# Patient Record
Sex: Male | Born: 1935 | Marital: Married
Health system: Southern US, Community
[De-identification: ages and names within clinical notes are randomized; demographics above are authoritative.]

---

## 2004-06-25 ENCOUNTER — Emergency Department: Payer: Self-pay | Admitting: Emergency Medicine

## 2006-10-14 ENCOUNTER — Other Ambulatory Visit: Payer: Self-pay

## 2006-10-14 ENCOUNTER — Emergency Department: Payer: Self-pay | Admitting: Unknown Physician Specialty

## 2009-05-06 ENCOUNTER — Ambulatory Visit: Payer: Self-pay | Admitting: Family Medicine

## 2009-05-21 ENCOUNTER — Ambulatory Visit: Payer: Self-pay | Admitting: Urology

## 2009-07-24 ENCOUNTER — Emergency Department: Payer: Self-pay | Admitting: Emergency Medicine

## 2010-04-20 ENCOUNTER — Encounter: Payer: Self-pay | Admitting: Cardiology

## 2010-04-30 ENCOUNTER — Encounter: Payer: Self-pay | Admitting: Cardiology

## 2010-05-26 ENCOUNTER — Emergency Department: Payer: Self-pay | Admitting: Emergency Medicine

## 2010-06-01 ENCOUNTER — Encounter: Payer: Self-pay | Admitting: Cardiology

## 2010-06-04 ENCOUNTER — Emergency Department: Payer: Self-pay | Admitting: Unknown Physician Specialty

## 2010-06-30 ENCOUNTER — Encounter: Payer: Self-pay | Admitting: Cardiology

## 2010-07-30 ENCOUNTER — Encounter: Payer: Self-pay | Admitting: Cardiology

## 2010-08-30 ENCOUNTER — Encounter: Payer: Self-pay | Admitting: Cardiology

## 2011-01-20 ENCOUNTER — Encounter: Payer: Self-pay | Admitting: Internal Medicine

## 2011-01-29 ENCOUNTER — Encounter: Payer: Self-pay | Admitting: Internal Medicine

## 2011-02-28 ENCOUNTER — Encounter: Payer: Self-pay | Admitting: Internal Medicine

## 2011-03-31 ENCOUNTER — Encounter: Payer: Self-pay | Admitting: Internal Medicine

## 2012-06-01 ENCOUNTER — Encounter: Payer: Self-pay | Admitting: Internal Medicine

## 2012-06-09 ENCOUNTER — Ambulatory Visit: Payer: Self-pay | Admitting: Vascular Surgery

## 2012-06-13 LAB — CBC WITH DIFFERENTIAL/PLATELET
Eosinophil %: 0.9 %
HCT: 27.8 % — ABNORMAL LOW (ref 40.0–52.0)
Lymphocyte #: 0.9 10*3/uL — ABNORMAL LOW (ref 1.0–3.6)
MCH: 28.7 pg (ref 26.0–34.0)
MCV: 89 fL (ref 80–100)
Monocyte #: 1.5 x10 3/mm — ABNORMAL HIGH (ref 0.2–1.0)
Monocyte %: 16.6 %
Neutrophil #: 6.2 10*3/uL (ref 1.4–6.5)
Neutrophil %: 70.3 %
RBC: 3.11 10*6/uL — ABNORMAL LOW (ref 4.40–5.90)
RDW: 22.6 % — ABNORMAL HIGH (ref 11.5–14.5)
WBC: 8.8 10*3/uL (ref 3.8–10.6)

## 2012-06-14 LAB — CBC WITH DIFFERENTIAL/PLATELET
Basophil #: 0.1 10*3/uL (ref 0.0–0.1)
Eosinophil #: 0.1 10*3/uL (ref 0.0–0.7)
HCT: 26.6 % — ABNORMAL LOW (ref 40.0–52.0)
HGB: 8.6 g/dL — ABNORMAL LOW (ref 13.0–18.0)
Lymphocyte #: 0.6 10*3/uL — ABNORMAL LOW (ref 1.0–3.6)
MCH: 29 pg (ref 26.0–34.0)
MCHC: 32.3 g/dL (ref 32.0–36.0)
MCV: 90 fL (ref 80–100)
Monocyte #: 1.1 x10 3/mm — ABNORMAL HIGH (ref 0.2–1.0)
Neutrophil #: 5.2 10*3/uL (ref 1.4–6.5)
Platelet: 417 10*3/uL (ref 150–440)
RBC: 2.97 10*6/uL — ABNORMAL LOW (ref 4.40–5.90)
RDW: 22.5 % — ABNORMAL HIGH (ref 11.5–14.5)

## 2012-06-14 LAB — COMPREHENSIVE METABOLIC PANEL
Albumin: 2.6 g/dL — ABNORMAL LOW (ref 3.4–5.0)
Alkaline Phosphatase: 76 U/L (ref 50–136)
BUN: 34 mg/dL — ABNORMAL HIGH (ref 7–18)
Bilirubin,Total: 0.9 mg/dL (ref 0.2–1.0)
Calcium, Total: 7.9 mg/dL — ABNORMAL LOW (ref 8.5–10.1)
Creatinine: 3.26 mg/dL — ABNORMAL HIGH (ref 0.60–1.30)
Glucose: 196 mg/dL — ABNORMAL HIGH (ref 65–99)
SGOT(AST): 52 U/L — ABNORMAL HIGH (ref 15–37)
SGPT (ALT): 107 U/L — ABNORMAL HIGH (ref 12–78)
Total Protein: 4.7 g/dL — ABNORMAL LOW (ref 6.4–8.2)

## 2012-06-30 ENCOUNTER — Encounter: Payer: Self-pay | Admitting: Internal Medicine

## 2012-07-06 LAB — COMPREHENSIVE METABOLIC PANEL
Alkaline Phosphatase: 74 U/L (ref 50–136)
Anion Gap: 6 — ABNORMAL LOW (ref 7–16)
BUN: 40 mg/dL — ABNORMAL HIGH (ref 7–18)
Bilirubin,Total: 1.2 mg/dL — ABNORMAL HIGH (ref 0.2–1.0)
Co2: 32 mmol/L (ref 21–32)
Creatinine: 3.14 mg/dL — ABNORMAL HIGH (ref 0.60–1.30)
EGFR (Non-African Amer.): 18 — ABNORMAL LOW
Potassium: 3.9 mmol/L (ref 3.5–5.1)
SGOT(AST): 13 U/L — ABNORMAL LOW (ref 15–37)
SGPT (ALT): 12 U/L (ref 12–78)

## 2012-07-30 ENCOUNTER — Encounter: Payer: Self-pay | Admitting: Internal Medicine

## 2012-07-30 ENCOUNTER — Ambulatory Visit: Payer: Self-pay | Admitting: Internal Medicine

## 2012-08-08 ENCOUNTER — Inpatient Hospital Stay: Payer: Self-pay | Admitting: Family Medicine

## 2012-08-08 LAB — COMPREHENSIVE METABOLIC PANEL
Albumin: 2.7 g/dL — ABNORMAL LOW (ref 3.4–5.0)
Bilirubin,Total: 1.3 mg/dL — ABNORMAL HIGH (ref 0.2–1.0)
Chloride: 97 mmol/L — ABNORMAL LOW (ref 98–107)
Co2: 33 mmol/L — ABNORMAL HIGH (ref 21–32)
Glucose: 237 mg/dL — ABNORMAL HIGH (ref 65–99)
SGOT(AST): 20 U/L (ref 15–37)
SGPT (ALT): 16 U/L (ref 12–78)
Sodium: 136 mmol/L (ref 136–145)
Total Protein: 5.2 g/dL — ABNORMAL LOW (ref 6.4–8.2)

## 2012-08-08 LAB — CBC
HCT: 32.5 % — ABNORMAL LOW (ref 40.0–52.0)
MCH: 28.6 pg (ref 26.0–34.0)
MCHC: 31.5 g/dL — ABNORMAL LOW (ref 32.0–36.0)
MCV: 91 fL (ref 80–100)
RBC: 3.57 10*6/uL — ABNORMAL LOW (ref 4.40–5.90)
RDW: 21.8 % — ABNORMAL HIGH (ref 11.5–14.5)
WBC: 16.8 10*3/uL — ABNORMAL HIGH (ref 3.8–10.6)

## 2012-08-08 LAB — TROPONIN I: Troponin-I: 0.07 ng/mL — ABNORMAL HIGH

## 2012-08-08 LAB — CK TOTAL AND CKMB (NOT AT ARMC)
CK-MB: 1.7 ng/mL (ref 0.5–3.6)
CK-MB: 1.4 ng/mL (ref 0.5–3.6)

## 2012-08-09 LAB — BASIC METABOLIC PANEL
Anion Gap: 15 (ref 7–16)
Calcium, Total: 7.8 mg/dL — ABNORMAL LOW (ref 8.5–10.1)
Chloride: 97 mmol/L — ABNORMAL LOW (ref 98–107)
EGFR (Non-African Amer.): 23 — ABNORMAL LOW
Glucose: 210 mg/dL — ABNORMAL HIGH (ref 65–99)
Osmolality: 285 (ref 275–301)
Potassium: 4.1 mmol/L (ref 3.5–5.1)
Sodium: 137 mmol/L (ref 136–145)

## 2012-08-09 LAB — CBC WITH DIFFERENTIAL/PLATELET
Basophil #: 0.1 10*3/uL (ref 0.0–0.1)
Basophil %: 0.6 %
Eosinophil #: 0 10*3/uL (ref 0.0–0.7)
HCT: 30.6 % — ABNORMAL LOW (ref 40.0–52.0)
HGB: 9.6 g/dL — ABNORMAL LOW (ref 13.0–18.0)
MCH: 28.7 pg (ref 26.0–34.0)
MCHC: 31.4 g/dL — ABNORMAL LOW (ref 32.0–36.0)
Monocyte #: 1.3 x10 3/mm — ABNORMAL HIGH (ref 0.2–1.0)
Monocyte %: 8.5 %
Neutrophil #: 13.1 10*3/uL — ABNORMAL HIGH (ref 1.4–6.5)
Neutrophil %: 86.4 %
RDW: 21.2 % — ABNORMAL HIGH (ref 11.5–14.5)
WBC: 15.2 10*3/uL — ABNORMAL HIGH (ref 3.8–10.6)

## 2012-08-09 LAB — CK TOTAL AND CKMB (NOT AT ARMC)
CK, Total: 32 U/L — ABNORMAL LOW (ref 35–232)
CK-MB: 1.2 ng/mL (ref 0.5–3.6)

## 2012-08-09 LAB — HEMOGLOBIN: HGB: 9.7 g/dL — ABNORMAL LOW (ref 13.0–18.0)

## 2012-08-09 LAB — PROTIME-INR: Prothrombin Time: 19.2 secs — ABNORMAL HIGH (ref 11.5–14.7)

## 2012-08-10 LAB — BASIC METABOLIC PANEL
Anion Gap: 10 (ref 7–16)
BUN: 39 mg/dL — ABNORMAL HIGH (ref 7–18)
Chloride: 98 mmol/L (ref 98–107)
Co2: 29 mmol/L (ref 21–32)
Creatinine: 2.95 mg/dL — ABNORMAL HIGH (ref 0.60–1.30)
EGFR (African American): 22 — ABNORMAL LOW
EGFR (Non-African Amer.): 19 — ABNORMAL LOW
Osmolality: 284 (ref 275–301)
Sodium: 137 mmol/L (ref 136–145)

## 2012-08-10 LAB — CBC WITH DIFFERENTIAL/PLATELET
HGB: 9.2 g/dL — ABNORMAL LOW (ref 13.0–18.0)
Lymphocytes: 7 %
MCH: 29.8 pg (ref 26.0–34.0)
MCHC: 33.1 g/dL (ref 32.0–36.0)
Monocytes: 9 %
RBC: 3.07 10*6/uL — ABNORMAL LOW (ref 4.40–5.90)
Segmented Neutrophils: 80 %
WBC: 17.4 10*3/uL — ABNORMAL HIGH (ref 3.8–10.6)

## 2012-08-10 LAB — LIPID PANEL: Cholesterol: 60 mg/dL (ref 0–200)

## 2012-08-10 LAB — PHOSPHORUS: Phosphorus: 4.1 mg/dL (ref 2.5–4.9)

## 2012-08-11 LAB — HEMOGLOBIN: HGB: 7.7 g/dL — ABNORMAL LOW (ref 13.0–18.0)

## 2012-08-12 LAB — CBC WITH DIFFERENTIAL/PLATELET
Basophil #: 0 10*3/uL (ref 0.0–0.1)
Eosinophil #: 0 10*3/uL (ref 0.0–0.7)
HCT: 26 % — ABNORMAL LOW (ref 40.0–52.0)
HGB: 8.6 g/dL — ABNORMAL LOW (ref 13.0–18.0)
Lymphocyte #: 0.6 10*3/uL — ABNORMAL LOW (ref 1.0–3.6)
Lymphocyte %: 6.3 %
MCH: 29.8 pg (ref 26.0–34.0)
MCHC: 33.2 g/dL (ref 32.0–36.0)
MCV: 90 fL (ref 80–100)
Neutrophil #: 7.8 10*3/uL — ABNORMAL HIGH (ref 1.4–6.5)
Neutrophil %: 81.6 %
Platelet: 504 10*3/uL — ABNORMAL HIGH (ref 150–440)
RDW: 19.7 % — ABNORMAL HIGH (ref 11.5–14.5)
WBC: 9.5 10*3/uL (ref 3.8–10.6)

## 2012-08-12 LAB — PHOSPHORUS: Phosphorus: 3.7 mg/dL (ref 2.5–4.9)

## 2012-08-13 LAB — CBC WITH DIFFERENTIAL/PLATELET
Basophil #: 0.1 10*3/uL
Basophil %: 0.5 %
Eosinophil #: 0.1 10*3/uL
Eosinophil %: 0.6 %
HCT: 28.5 % — ABNORMAL LOW
HGB: 9.6 g/dL — ABNORMAL LOW
Lymphocyte %: 8.4 %
Lymphs Abs: 0.8 10*3/uL — ABNORMAL LOW
MCH: 30.3 pg
MCHC: 33.9 g/dL
MCV: 90 fL
Monocyte #: 1.4 10*3/uL — ABNORMAL HIGH
Monocyte %: 14.2 %
Neutrophil #: 7.5 10*3/uL — ABNORMAL HIGH
Neutrophil %: 76.3 %
Platelet: 493 10*3/uL — ABNORMAL HIGH
RBC: 3.18 x10 6/mm 3 — ABNORMAL LOW
RDW: 19.9 % — ABNORMAL HIGH
WBC: 9.9 10*3/uL

## 2012-08-13 LAB — BASIC METABOLIC PANEL
Anion Gap: 6 — ABNORMAL LOW (ref 7–16)
BUN: 25 mg/dL — ABNORMAL HIGH (ref 7–18)
Calcium, Total: 7.6 mg/dL — ABNORMAL LOW (ref 8.5–10.1)
Chloride: 101 mmol/L (ref 98–107)
EGFR (African American): 34 — ABNORMAL LOW
EGFR (Non-African Amer.): 29 — ABNORMAL LOW
Glucose: 125 mg/dL — ABNORMAL HIGH (ref 65–99)
Osmolality: 282 (ref 275–301)
Sodium: 138 mmol/L (ref 136–145)

## 2012-08-15 LAB — CBC WITH DIFFERENTIAL/PLATELET
Eosinophil: 3 %
MCH: 28.6 pg (ref 26.0–34.0)
MCHC: 31.5 g/dL — ABNORMAL LOW (ref 32.0–36.0)
MCV: 91 fL (ref 80–100)
Metamyelocyte: 1 %
Monocytes: 8 %
Myelocyte: 2 %
Platelet: 596 10*3/uL — ABNORMAL HIGH (ref 150–440)
RDW: 20.1 % — ABNORMAL HIGH (ref 11.5–14.5)

## 2012-08-15 LAB — BASIC METABOLIC PANEL
Anion Gap: 9 (ref 7–16)
BUN: 43 mg/dL — ABNORMAL HIGH (ref 7–18)
Calcium, Total: 7.6 mg/dL — ABNORMAL LOW (ref 8.5–10.1)
EGFR (African American): 21 — ABNORMAL LOW
EGFR (Non-African Amer.): 18 — ABNORMAL LOW
Osmolality: 282 (ref 275–301)
Potassium: 4 mmol/L (ref 3.5–5.1)

## 2012-08-17 LAB — CBC WITH DIFFERENTIAL/PLATELET
Bands: 2 %
HGB: 9.2 g/dL — ABNORMAL LOW (ref 13.0–18.0)
MCH: 28.5 pg (ref 26.0–34.0)
MCHC: 31.4 g/dL — ABNORMAL LOW (ref 32.0–36.0)
Metamyelocyte: 3 %
Myelocyte: 2 %
RBC: 3.24 10*6/uL — ABNORMAL LOW (ref 4.40–5.90)
RDW: 19.9 % — ABNORMAL HIGH (ref 11.5–14.5)
Segmented Neutrophils: 76 %

## 2012-08-17 LAB — BASIC METABOLIC PANEL
Calcium, Total: 7.5 mg/dL — ABNORMAL LOW (ref 8.5–10.1)
Co2: 25 mmol/L (ref 21–32)
Creatinine: 2.87 mg/dL — ABNORMAL HIGH (ref 0.60–1.30)
EGFR (Non-African Amer.): 20 — ABNORMAL LOW
Osmolality: 283 (ref 275–301)

## 2012-08-17 LAB — PHOSPHORUS: Phosphorus: 2.5 mg/dL (ref 2.5–4.9)

## 2012-08-18 LAB — CBC WITH DIFFERENTIAL/PLATELET
HCT: 30.9 % — ABNORMAL LOW (ref 40.0–52.0)
Lymphocytes: 19 %
MCH: 28.3 pg (ref 26.0–34.0)
MCHC: 31.1 g/dL — ABNORMAL LOW (ref 32.0–36.0)
MCV: 91 fL (ref 80–100)
Monocytes: 4 %
Platelet: 697 10*3/uL — ABNORMAL HIGH (ref 150–440)
RBC: 3.39 10*6/uL — ABNORMAL LOW (ref 4.40–5.90)
RDW: 19.3 % — ABNORMAL HIGH (ref 11.5–14.5)
Segmented Neutrophils: 72 %
WBC: 10.3 10*3/uL (ref 3.8–10.6)

## 2012-08-19 LAB — BASIC METABOLIC PANEL
Anion Gap: 6 — ABNORMAL LOW (ref 7–16)
BUN: 28 mg/dL — ABNORMAL HIGH (ref 7–18)
Calcium, Total: 7.6 mg/dL — ABNORMAL LOW (ref 8.5–10.1)
Chloride: 101 mmol/L (ref 98–107)
EGFR (African American): 26 — ABNORMAL LOW
EGFR (Non-African Amer.): 23 — ABNORMAL LOW
Glucose: 144 mg/dL — ABNORMAL HIGH (ref 65–99)
Osmolality: 286 (ref 275–301)
Potassium: 3.6 mmol/L (ref 3.5–5.1)

## 2012-08-19 LAB — CBC WITH DIFFERENTIAL/PLATELET
Basophil #: 0 10*3/uL (ref 0.0–0.1)
Basophil %: 0.4 %
Eosinophil #: 0.1 10*3/uL (ref 0.0–0.7)
HCT: 27.7 % — ABNORMAL LOW (ref 40.0–52.0)
Lymphocyte #: 0.9 10*3/uL — ABNORMAL LOW (ref 1.0–3.6)
Lymphocyte %: 9.7 %
MCH: 29.2 pg (ref 26.0–34.0)
MCHC: 32.4 g/dL (ref 32.0–36.0)
MCV: 90 fL (ref 80–100)
Monocyte #: 1.4 x10 3/mm — ABNORMAL HIGH (ref 0.2–1.0)
Neutrophil #: 7.1 10*3/uL — ABNORMAL HIGH (ref 1.4–6.5)
RDW: 19.7 % — ABNORMAL HIGH (ref 11.5–14.5)
WBC: 9.5 10*3/uL (ref 3.8–10.6)

## 2012-08-19 LAB — PHOSPHORUS: Phosphorus: 2.5 mg/dL (ref 2.5–4.9)

## 2012-08-21 LAB — URINALYSIS, COMPLETE
Bacteria: NONE SEEN
Blood: NEGATIVE
Glucose,UR: 50 mg/dL (ref 0–75)
Leukocyte Esterase: NEGATIVE
Nitrite: NEGATIVE
Ph: 5 (ref 4.5–8.0)
Protein: 100
RBC,UR: 1 /HPF (ref 0–5)
Specific Gravity: 1.02 (ref 1.003–1.030)

## 2012-08-21 LAB — RENAL FUNCTION PANEL
Albumin: 1.9 g/dL — ABNORMAL LOW (ref 3.4–5.0)
Anion Gap: 8 (ref 7–16)
BUN: 26 mg/dL — ABNORMAL HIGH (ref 7–18)
Calcium, Total: 7.8 mg/dL — ABNORMAL LOW (ref 8.5–10.1)
Co2: 30 mmol/L (ref 21–32)
Creatinine: 2.7 mg/dL — ABNORMAL HIGH (ref 0.60–1.30)
EGFR (African American): 25 — ABNORMAL LOW
Glucose: 163 mg/dL — ABNORMAL HIGH (ref 65–99)
Phosphorus: 2.4 mg/dL — ABNORMAL LOW (ref 2.5–4.9)
Sodium: 139 mmol/L (ref 136–145)

## 2012-08-21 LAB — CBC WITH DIFFERENTIAL/PLATELET
Basophil %: 0.7 %
HCT: 30.7 % — ABNORMAL LOW (ref 40.0–52.0)
HGB: 9.4 g/dL — ABNORMAL LOW (ref 13.0–18.0)
Lymphocyte #: 0.6 10*3/uL — ABNORMAL LOW (ref 1.0–3.6)
MCH: 28.2 pg (ref 26.0–34.0)
MCHC: 30.8 g/dL — ABNORMAL LOW (ref 32.0–36.0)
MCV: 92 fL (ref 80–100)
Monocyte #: 1.2 x10 3/mm — ABNORMAL HIGH (ref 0.2–1.0)
Monocyte %: 9.5 %
Neutrophil #: 10.8 10*3/uL — ABNORMAL HIGH (ref 1.4–6.5)
Platelet: 663 10*3/uL — ABNORMAL HIGH (ref 150–440)
RDW: 19.6 % — ABNORMAL HIGH (ref 11.5–14.5)

## 2012-08-22 DIAGNOSIS — I472 Ventricular tachycardia: Secondary | ICD-10-CM

## 2012-08-23 LAB — PHOSPHORUS: Phosphorus: 2.6 mg/dL (ref 2.5–4.9)

## 2012-08-30 ENCOUNTER — Ambulatory Visit: Payer: Self-pay | Admitting: Internal Medicine

## 2012-08-30 DEATH — deceased

## 2014-04-09 IMAGING — CT CT HEAD WITHOUT CONTRAST
2 series · 15 of 30 positions shown, 19 images · non-contrast
Comparison: none

REASON FOR EXAM: fall hit head
COMMENTS:   May transport without cardiac monitor

PROCEDURE:     CT  - CT HEAD WITHOUT CONTRAST  - August 08, 2012 [DATE]
RESULT:     Technique: Helical noncontrasted 5 mm sections were obtained
from the skull base through the vertex.

[Series 2: without · axial · non-contrast · 0.46mm/px · z∈[-175,-45]mm · 13 of 32 slices shown, 17 images]
[im 3/32  brain]
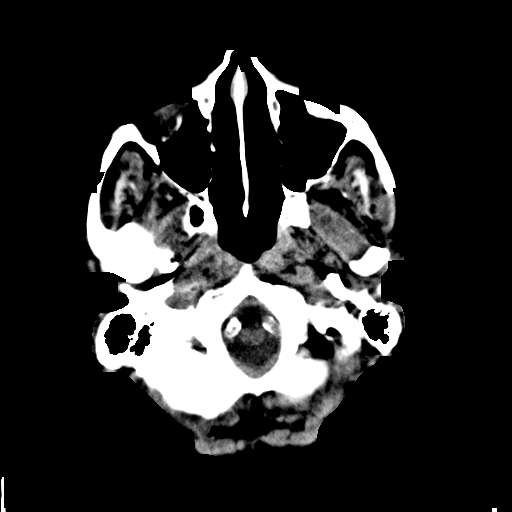
[im 3/32  bone]
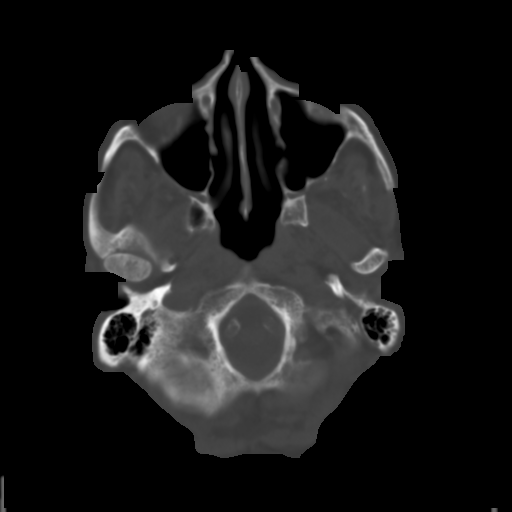
[im 5/32  brain]
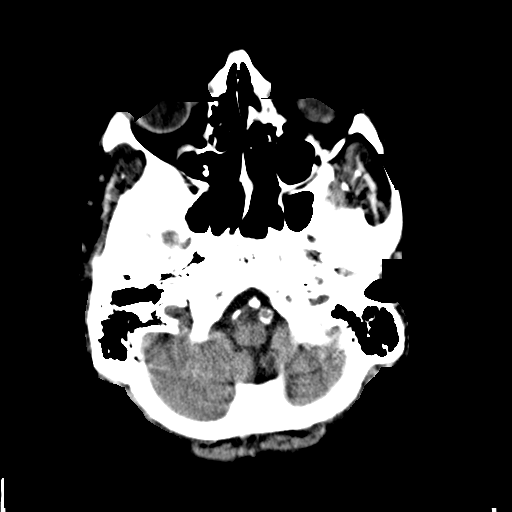
[im 7/32  brain]
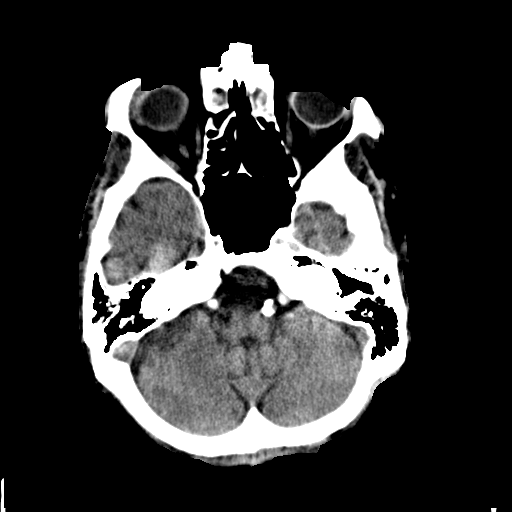
[im 9/32  brain]
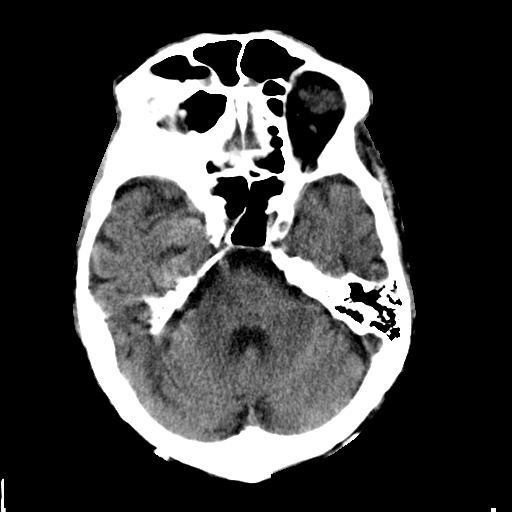
[im 12/32  brain]
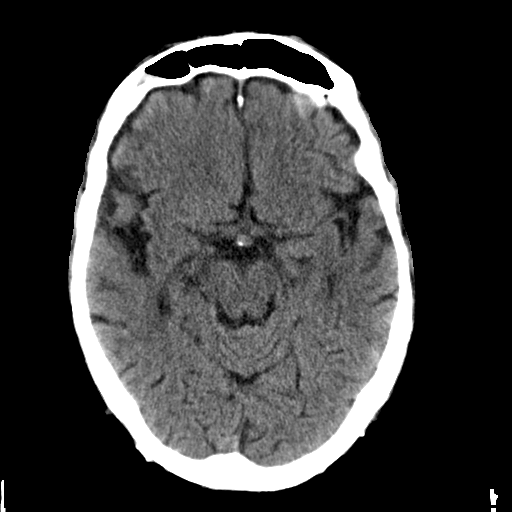
[im 12/32  bone]
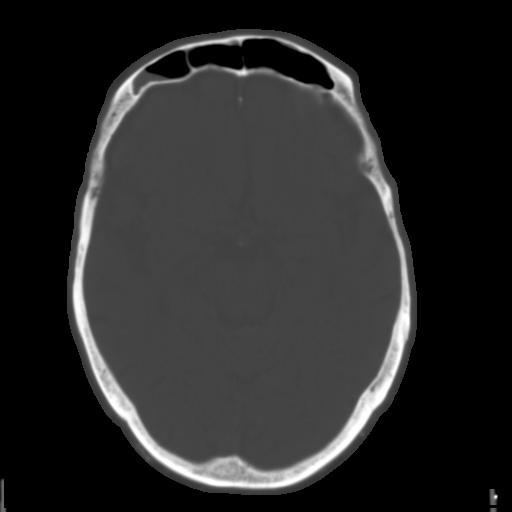
[im 14/32  brain]
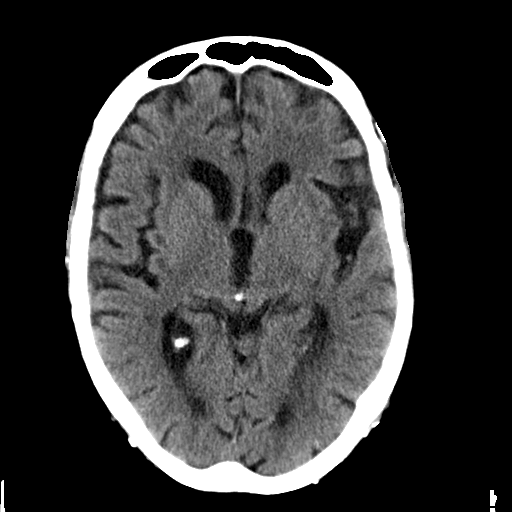
[im 16/32  brain]
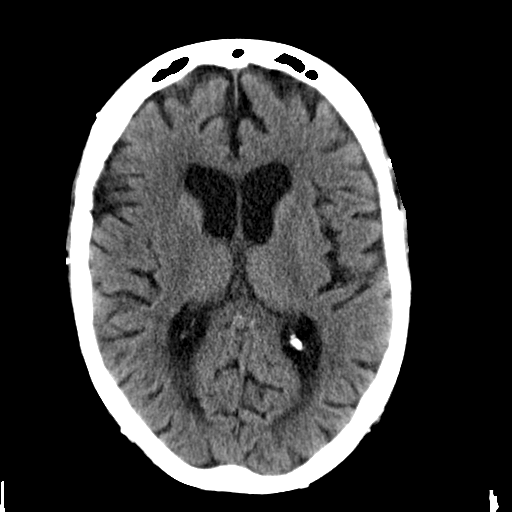
[im 18/32  brain]
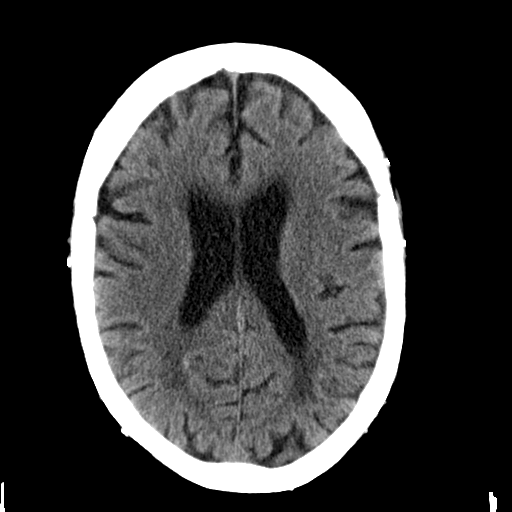
[im 20/32  brain]
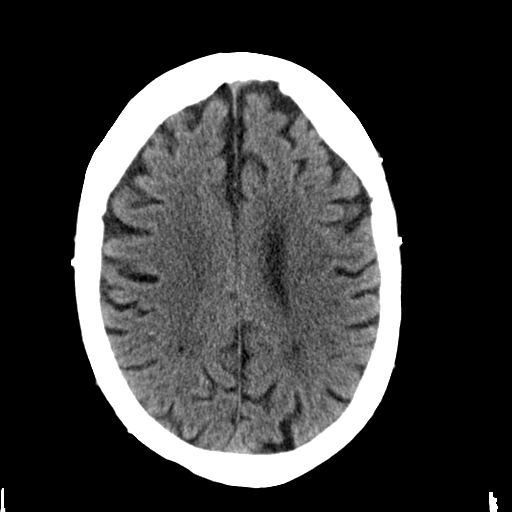
[im 20/32  bone]
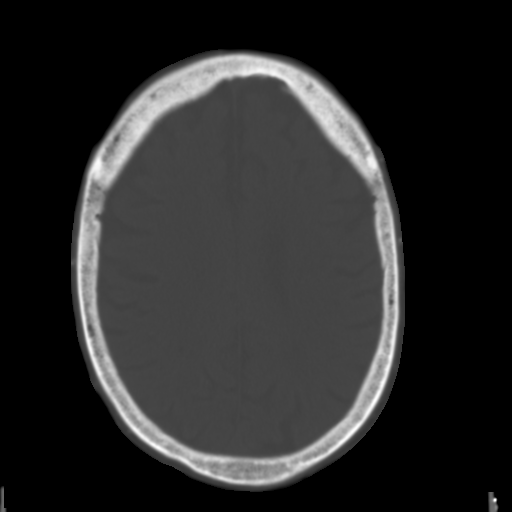
[im 23/32  brain]
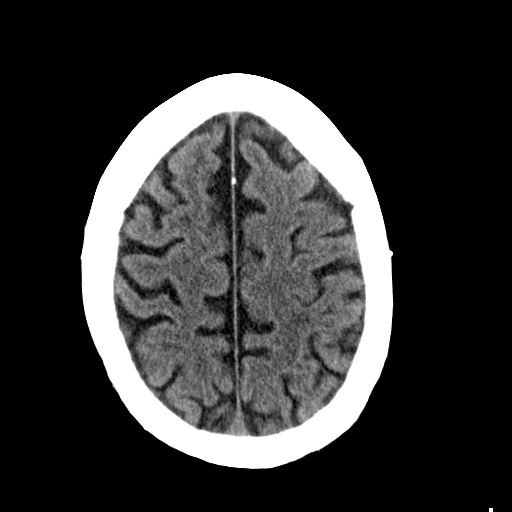
[im 25/32  brain]
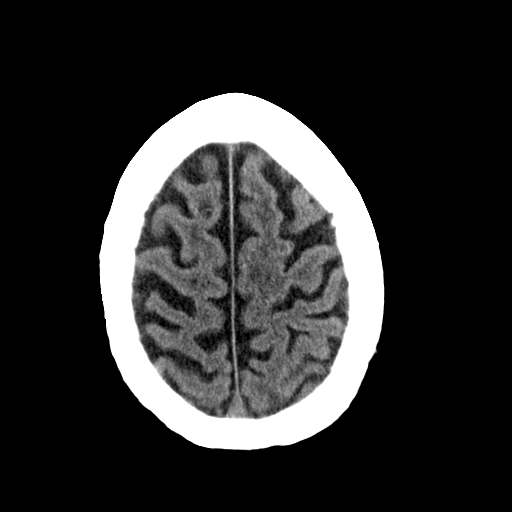
[im 27/32  brain]
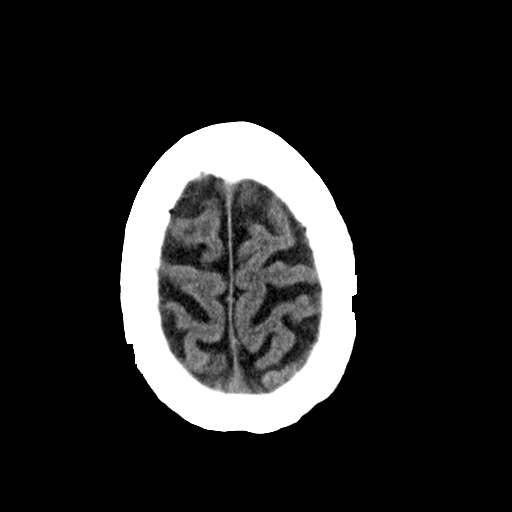
[im 29/32  brain]
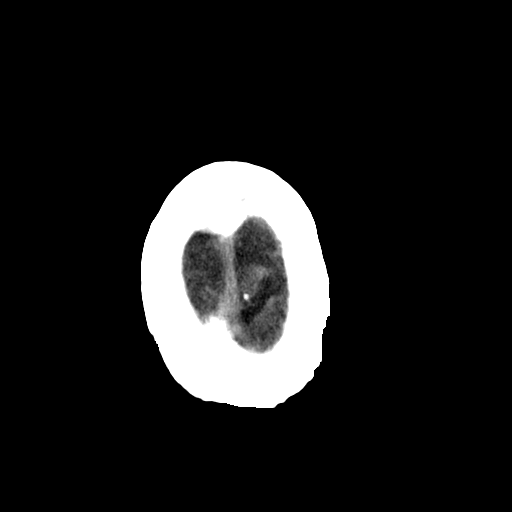
[im 29/32  bone]
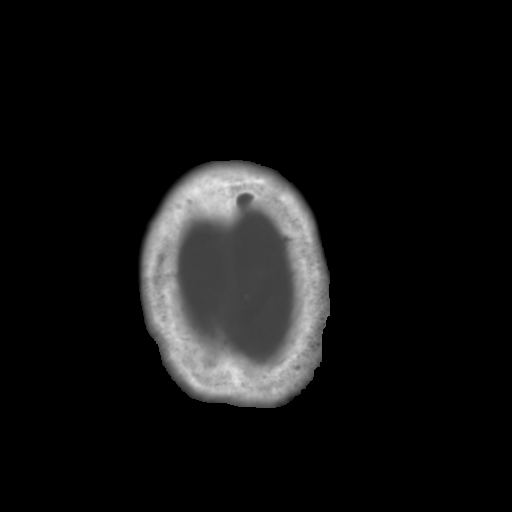

[Series 3: bone · axial · 0.46mm/px · z∈[-175,-155]mm · 2 of 32 slices shown]
[im 3/32  bone]
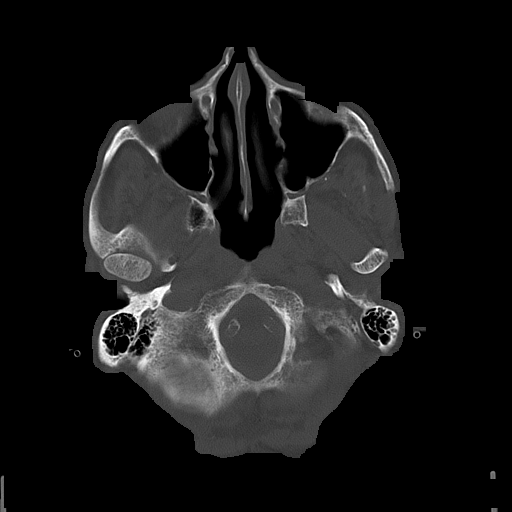
[im 7/32  bone]
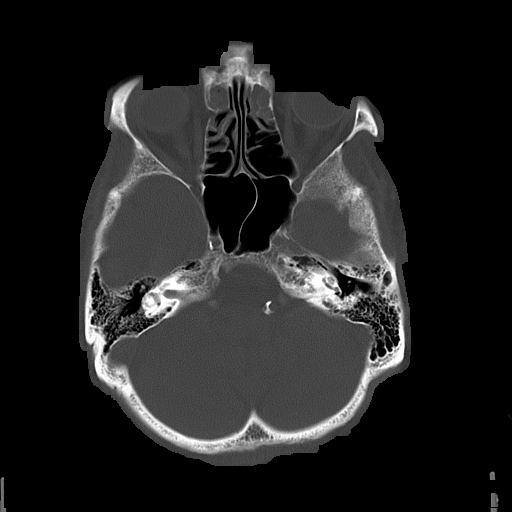

[15 of 30 positions shown; findings below may reference images not displayed]

FINDINGS: Diffuse cortical and cerebellar atrophy is identified as well as
diffuse areas of low attenuation within the subcortical, deep and
periventricular white matter regions. There is not evidence of intra-axial
nor extra-axial fluid collections, acute hemorrhage, mass effect, nor a
depressed skull fracture. The visualized paranasal sinuses and mastoid air
cells are patent.
IMPRESSION: Chronic and involutional changes without evidence of acute
abnormalities. If there is persistent clinical concern further evaluation
with MRI is recommended.
2. A comparison was made to prior study 06/04/2010.Technique: Helical
noncontrasted 5 mm sections were obtained from the skull base through the
vertex.
FINDINGS: Diffuse cortical and cerebellar atrophy is identified as well as
diffuse areas of low attenuation within the subcortical, deep and
periventricular white matter regions. There is not evidence of intra-axial
nor extra-axial fluid collections, acute hemorrhage, mass effect, nor a
depressed skull fracture. The visualized paranasal sinuses and mastoid air
cells are patent.
IMPRESSION: Chronic and involutional changes without evidence of acute
abnormalities. If there is persistent clinical concern further evaluation
with MRI is recommended.

## 2014-04-10 IMAGING — XA DG C-ARM 1-60 MIN
3 series · 3 of 3 positions shown · non-contrast
Comparison: none

REASON FOR EXAM: right hip fx
COMMENTS:

PROCEDURE:     DXR - DXR C-ARM WITH 2 VIEWS RT HIP  - August 09, 2012  [DATE]
RESULT:
The hardware appears intact, grossly. The osseous structures demonstrate
near anatomic alignment.

[Series 6001: mi1 · 1 of 1 slices shown]
[im 1/1]
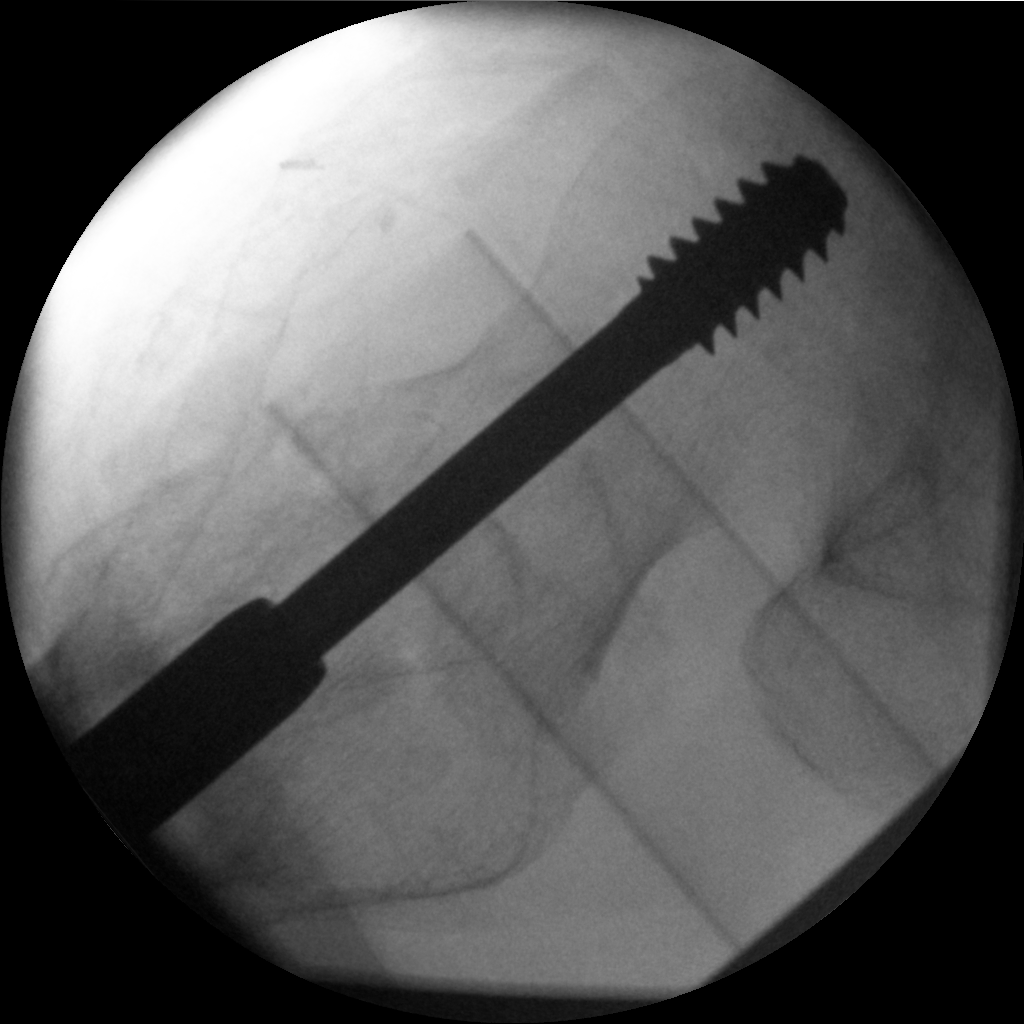

[Series 6002: mi2 · 1 of 1 slices shown]
[im 1/1]
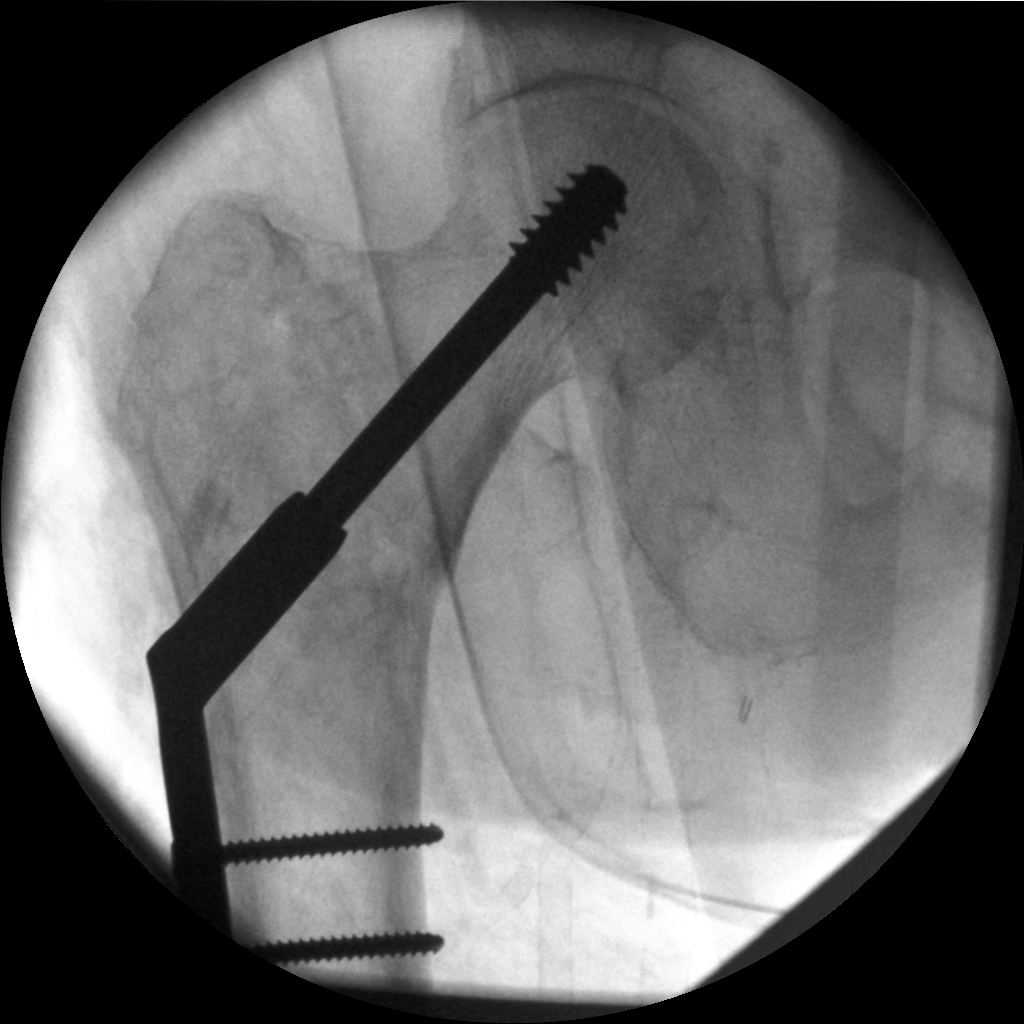

[Series 6003: mi3 · 1 of 1 slices shown]
[im 1/1]
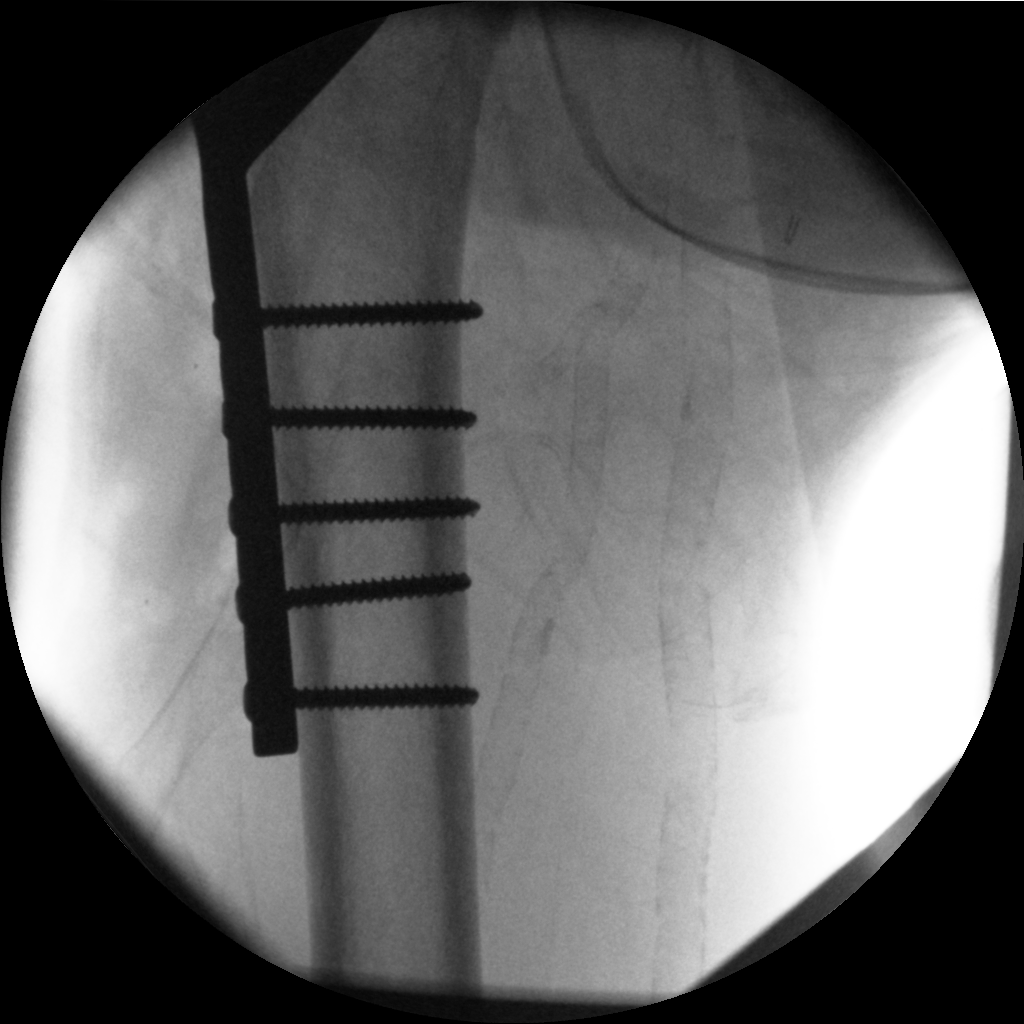

[3 of 3 positions shown; findings below may reference images not displayed]

IMPRESSION: Open reduction and internal fixation of right hip.

The remainder of the interpretation will be left to the performing
physician.

## 2014-12-17 NOTE — Consult Note (Signed)
    Comments   Pt unable to tolerate morphine elixir. Will write Rx for continuous morphine via CADD pump to [facility]. Orders for Hospice Home completed.   Electronic Signatures: Jesseca Marsch, Harriett SineNancy (MD)  (Signed 26-Dec-13 14:56)  Authored: Palliative Care   Last Updated: 26-Dec-13 14:56 by Saket Hellstrom, Harriett SineNancy (MD)

## 2014-12-17 NOTE — Consult Note (Signed)
PATIENT NAME:  Caleb Hanna, Caleb Hanna MR#:  [id number] DATE OF BIRTH:  [birth year]  DATE OF CONSULTATION:  08/11/2012  REQUESTING PHYSICIAN:  Dr Elisabeth Pigeon.   CONSULTING PHYSICIAN:  Caleb Regis C. Antwain Caliendo, MD  REASON FOR CONSULTATION: Scrotal swelling and tenderness.  HISTORY OF PRESENT ILLNESS: An 81 year old white male admitted 08/08/12 with a displaced right trochanteric hip fracture and underwent nailing on 08/09/2012. He was noted this morning to have scrotal swelling, and urology consultation was requested. The patient states he has a long history of right hemiscrotal swelling and denies any tenderness or discomfort. He has end-stage renal disease and is on dialysis. He has a history of benign prostatic hypertrophy and is on Avodart. Other than his hip pain he has no complaints.   PAST MEDICAL HISTORY:  1. Coronary artery disease.  2. Anemia of chronic disease.  3. Hyperlipidemia.  4. Diabetes.  5. End-stage renal disease, on hemodialysis.  6. Arthritis.  7. Hypertension.   PAST SURGICAL HISTORY:  1. Coronary artery bypass graft.  2. Cholecystectomy.  3. Appendectomy.  4. Left wrist surgery.  5. As per the HPI.   ALLERGIES: NKDA.   MEDICATIONS ON ADMISSION:  1. Vitamin D3 at 1000 international units daily.  2. Tramadol 50 mg b.i.d.  3. Tamsulosin 0.4 mg daily.  4. Renal capsules 1 daily.  5. Ranitidine 150 mg twice daily.  6. Norco 5/325 one to 2 tablets q.4 hours p.r.n. pain.  7. Magnesium 400 mg daily.  8. Imdur 1 tablet daily. 9. Irbesartan 300 mg daily.  10. Glipizide XL 1/2 tab daily.  11. Cetirizine 10 mg daily.  12. Coreg 25 mg b.i.d.  13. Avodart 0.5 mg daily. 14. Atorvastatin 10 mg daily.  15. ASA 325 mg daily.   SOCIAL HISTORY: Denies tobacco or alcohol use.   REVIEW OF SYSTEMS. GENERAL: Denies fever or chills. EYES: Denies visual changes. ENT: Denies nasal congestion. PULMONARY: Denies cough, shortness of breath. CARDIOVASCULAR: Denies chest pain, palpitations.   GENITOURINARY: As per the HPI. GI: Denies nausea, vomiting, abdominal pain. ENDOCRINE: No temperature intolerance, decreased appetite. MUSCULOSKELETAL: Right hip pain, joint pain. DERMATOLOGIC:  Denies rash. NEUROLOGIC: Denies cerebrovascular accident/seizure. PSYCH: No depression, anxiety. HEME: No history of bleeding, clotting disorders.   PHYSICAL EXAMINATION:  VITAL SIGNS: Temperature 97.6, pulse 83, blood pressure 120/61.   GENERAL: Alert male in no acute distress.   HEENT: Moist mucous membranes.   NECK: Supple.   CARDIOVASCULAR: Regular rate and rhythm without murmur.   LUNGS: Clear to auscultation.  ABDOMEN: Soft, nontender without masses.   GU: Phallus uncircumcised without lesions. Left testis is descended, palpably normal. There is enlargement of the right hemiscrotum with a soft mobile mass consistent with fluid. The patient was examined while in ultrasound. There were multiple loops of bowel extending into the scrotum. The testis was not visualized, most likely obscured by bowel. There was no scrotal skin induration or thickness. There was some ecchymosis tracking from his right hip fracture.   EXTREMITIES: Positive edema.  SKIN: No noted rashes.   NEUROLOGIC: Patient alert, answers questions.   DATA: Scrotal ultrasound findings as above. The left testis was normal in appearance. There sonographically some enlargement of the left epididymis but no tenderness or enlargement noted on exam.   IMPRESSION: Right scrotal swelling secondary to hernia. No evidence of incarceration.   RECOMMENDATION: According to the patient this apparently has been longstanding. Should there be any pain, swelling or erythema, would recommend general surgery evaluation.    ____________________________ Verna Czech  Lonna CobbStoioff, MD scs:vtd D: 08/11/2012 17:06:47 ET T: 08/12/2012 08:29:49 ET JOB#: [id number]  cc: Lorin PicketScott C. Lonna CobbStoioff, MD, <Dictator> Riki AltesSCOTT C Calinda Stockinger MD ELECTRONICALLY SIGNED 08/16/2012 9:53

## 2014-12-17 NOTE — Consult Note (Signed)
Brief Consult Note: Diagnosis: RIH, scrotal hematoma.   Patient was seen by consultant.   Comments: Pt says RIH enlarged with fall. No nausea, vomiting, abdominal distention, obstipation or constipation. Thus no evidence of bowel obstruction, even partial. Large RIH is easily reducible with large defect. Also has a superficial subcutaneous hemi-scrotal hematoma. This is likely superficial to Dartos fascia. No indication for acute surgical intervention. High risk for elective RIH repair but if patient is interested and his medical risk of gebneral anesthesia is thought to be acceptible, refer him to me [facility]. Will sign off.  Electronic Signatures: Claude MangesMarterre, Lanetra Hartley F (MD)  (Signed 14-Dec-13 17:49)  Authored: Brief Consult Note   Last Updated: 14-Dec-13 17:49 by Claude MangesMarterre, Freddi Forster F (MD)

## 2014-12-17 NOTE — Consult Note (Signed)
EGD showed severe esophagitis from 25cm to GEJ at 42cm, small hiatal hernia.   Moderate to severe gastritis of entire body of stomach seen.  Recommend carafate slurry,  bid PPI, pureed diet, 5cc viscous xylocaine right before meals to help ease pain from swallowing.  Will check H. pylori blood test.    Electronic Signatures: Scot JunElliott, Robert T (MD)  (Signed on 20-Dec-13 15:51)  Authored  Last Updated: 20-Dec-13 15:51 by Scot JunElliott, Robert T (MD)

## 2014-12-17 NOTE — Consult Note (Signed)
Pt seen, eating supper.  Minimal right epigastric tenderness on my exam..  Discussed with NP who did consult.  Treat for possible yeast esophagitis and reflux esophagitis.  Soft foood.  Electronic Signatures: Scot JunElliott, Elvis Laufer T (MD)  (Signed on 12-Dec-13 17:56)  Authored  Last Updated: 12-Dec-13 17:56 by Scot JunElliott, Anees Vanecek T (MD)

## 2014-12-17 NOTE — H&P (Signed)
PATIENT NAME:  Caleb Hanna, Caleb Hanna MR#:  [id number] DATE OF BIRTH:  04-15-1930  DATE OF ADMISSION:  08/08/2012  PRIMARY CARE PHYSICIAN: Dione Housekeeper, MD  CHIEF COMPLAINT: Fall and hip fracture.   HISTORY OF PRESENT ILLNESS: This is an 81 year old very pleasant hard of hearing male with a history of end stage renal disease on hemodialysis, sacral decubitus, myelofibrosis, and coronary artery disease who presents today after a mechanical fall and unfortunately suffered a hip fracture. The hospitalist service is admitting the patient due to elevated troponins. The patient denies any chest pain or shortness of breath. He actually was checking on his wife who was in the bathroom. He was using his walker, lost balance, and then unfortunately fell right on top of his walker and was found by his son just lying on his walker face down. Again, there was no chest pain. The patient does say that he just lost his balance.   REVIEW OF SYSTEMS: CONSTITUTIONAL: No fever or fatigue. He has generalized weakness. EYES: No blurred or double vision. He does have cataracts. ENT: He has hearing loss. No seasonal allergies or postnasal drip. Positive snoring. RESPIRATORY: No cough, wheezing, or hemoptysis. No dyspnea. CARDIOVASCULAR: Denies any chest pain, palpitations, orthopnea, edema, or arrhythmia. GASTROINTESTINAL: No nausea, vomiting, diarrhea, abdominal pain, melena, or ulcers. GENITOURINARY: No dysuria or hematuria. ENDOCRINE: No polyuria or polydipsia. HEME/LYMPH: Positive bruising, positive anemia. MUSCULOSKELETAL: He has extreme pain in his hip. NEUROLOGIC: No history of cerebrovascular accident, transient ischemic attacks, or seizures. PSYCHIATRIC: No history of anxiety.   PAST MEDICAL HISTORY:  1. Coronary artery disease status post five-vessel CABG.  2. Chronic anemia.  3. Hyperlipidemia.  4. Diabetes.  5. End-stage renal disease on hemodialysis Tuesday, Thursday, and Saturday. He says Dr. Austin Miles.   6. Arthritis. 7. Hypertension.   PAST SURGICAL HISTORY:  1. Left wrist surgery.  2. Cholecystectomy.  3. Appendectomy.  4. Five vessel coronary artery bypass graft.   ALLERGIES: No known drug allergies.   MEDICATIONS:  1. Vitamin D3 1000 international units daily.  2. Tramadol 50 mg twice a day. 3. Tamsulosin 0.4 mg daily.  4. Renal caps 1 tablet daily.  5. Ranitidine 150 mg twice a day.  6. ProStep 3 mL daily.  7. Norco 5/325 mg every four hours p.r.n., 1 to 2 tablets, 8. Magnesium 400 mg daily.  9. Imdur one tablet daily.  10. Irbesartan 300 mg daily.  11. Glipizide XL 1/2 tablet daily.  12. Cetirizine 10 mg daily.  13. Coreg 25 mg twice a day. 14. Avodart 0.5 mg daily.  15. Atorvastatin 10 mg daily.  16. Aspirin 325 mg daily.   SOCIAL HISTORY: No tobacco, alcohol, or drug use. He lives with his wife.   FAMILY HISTORY: Positive for coronary artery disease.   PHYSICAL EXAMINATION:   VITAL SIGNS: Temperature is 96, pulse 96, respirations 24, blood pressure 161/81, and 93% on room air.   It should be noted that the patient did have a short episode of supraventricular tachycardia while in the Emergency Room.   GENERAL: The patient is in pain, alert, and does appear to be in distress due to his pain.   HEENT: Head is atraumatic. Pupils are round and reactive. Sclerae are anicteric. Mucous membranes are dry. Oropharynx is clear.  NECK: Supple. No jugular venous distention or carotid bruit.   CARDIOVASCULAR: Regular rate and rhythm. No murmurs, gallops, or rubs. PMI is not displaced.   PULMONARY: Lungs anteriorly are clear to auscultation without crackles,  rales, rhonchi, or wheezing.   ABDOMEN: Bowel sounds are present, nontender and nondistended. No hepatosplenomegaly.   EXTREMITIES: Extremities are very cold. Hard to palpate a pulse. He has 2+ pitting edema. I have asked the nurses to please use Doppler for pulses. I did not look at his sacral decubitus due to the  patient's hip fracture and hip pain.   NEUROLOGICAL: Cranial nerves II through XII are grossly intact. No focal deficits.   LABORATORY, DIAGNOSTIC AND RADIOLOGIC DATA: Sodium 133, potassium 4.0, chloride 97, bicarbonate 33, BUN 35, creatinine 3.38, glucose 237, calcium 8.1, bilirubin 1.3, alkaline phosphatase 92, ALT 16, AST 20, total protein 5.2, and albumin 2.7. White blood cells 16.8, hemoglobin 10.2, hematocrit 33, and platelets 812. INR 1.4. Troponin 0.07.  CT of the head shows no acute intracranial hemorrhage or cerebrovascular accident. There are chronic involutional changes without evidence of acute abnormalities.   Right hip x-ray shows a displaced intertrochanteric fracture of the right hip.   EKG shows normal sinus rhythm and left axis deviation with a right bundle branch block.   ASSESSMENT AND PLAN: An 81 year old male with history of coronary artery disease and end-stage renal disease on hemodialysis who suffered a mechanical fall and unfortunately now has a right hip fracture. The hospitalist service is admitting the patient due to elevated troponin.  1. Elevated troponin. I suspect this is likely due to poor renal clearance form his end-stage renal disease. There was no chest pain. It sounds like it was just a mechanical fall. However, given his history of coronary artery disease, I think it is prudent to admit the patient and get cardiac clearance prior to any kind of surgery. We will continue to follow and I have also consulted Dr. Lady GaryFath who the patient's family would like to see. If not, anybody from [facility] is acceptable.  2. We will continue telemetry and, as mentioned cardiac enzymes, aspirin, and Imdur. 3. End-stage renal disease, on hemodialysis. I have already spoken with Dr. Cherylann RatelLateef who will initiate dialysis. The patient will need dialysis prior to any kind of surgery.  4. Right hip fracture, intertrochanteric, secondary to a mechanical fall. Dr. Hyacinth MeekerMiller is  aware of the patient. He will need cardiac clearance and renal dialysis prior to the surgery.  5. History of myelofibrosis. We will continue to monitor.  6. Diabetes. Put the patient on sliding scale insulin and ADA diet when he is able to get a normal diet.  7. Benign prostatic hypertrophy. Continue with his outpatient medication.   CODE STATUS: The patient is DO NOT RESUSCITATE status.  TIME SPENT: Approximately 45 minutes. ____________________________ Janyth ContesSital P. Juliene PinaMody, MD spm:slb D: 08/08/2012 16:05:27 ET T: 08/08/2012 16:34:32 ET JOB#: [id number]  cc: Riyan Gavina P. Juliene PinaMody, MD, <Dictator> Karrie DoffingWilliam Selvidge, MD Janyth ContesSITAL P Lucious Zou MD ELECTRONICALLY SIGNED 08/08/2012 17:34

## 2014-12-17 NOTE — Consult Note (Signed)
Pt seen last week for dysphagia and odynophagia in lower chest area and treated with PPI and empiric nystatin.  He has not responded to this and continues to have symptoms of pain with swallowing liquids or solids.  He has multiple med problems, recent right hip fracture and surgery, myelodysplastic synd, previous 5V CABG.  Discussed with daughter and patient who is hard of hearing but alert and oriented and cognizant.  Will try for an EGD tomorrow last of morning.  Electronic Signatures: Scot JunElliott, Elleah Hemsley T (MD)  (Signed on 19-Dec-13 18:07)  Authored  Last Updated: 19-Dec-13 18:07 by Scot JunElliott, Estle Sabella T (MD)

## 2014-12-17 NOTE — Consult Note (Signed)
Pt swallowing better, ate sherbert and drank some Ensure.  Not a great improvement but is a start.  Continue same meds and diet, no new recommendations.  Electronic Signatures: Scot JunElliott, Robert T (MD)  (Signed on 22-Dec-13 18:40)  Authored  Last Updated: 22-Dec-13 18:40 by Scot JunElliott, Robert T (MD)

## 2014-12-17 NOTE — Op Note (Signed)
PATIENT NAME:  Caleb Hanna, Caleb Hanna MR#:  [id number] DATE OF BIRTH:  1930-03-19  DATE OF PROCEDURE:  06/09/2012  PREOPERATIVE DIAGNOSES:  1. Complication of AV dialysis device with massive hematoma formation, left arm.  2. End-stage renal disease requiring hemodialysis.  3. Painful left arm.  Swelling and edema, left arm.   POSTOPERATIVE DIAGNOSES:  1. Complication of AV dialysis device with massive hematoma formation, left arm.  2. End-stage renal disease requiring hemodialysis.  3. Painful left arm.  4. Swelling and edema, left arm.   PROCEDURES PERFORMED: Contrast injection of left arm brachiocephalic fistula.   SURGEON: Levora DredgeGregory Schnier, MD  SEDATION: Versed 2 mg plus fentanyl 50 mcg administered IV. Continuous ECG, pulse oximetry and cardiopulmonary monitoring is performed throughout the entire procedure by the interventional radiology nurse. Total sedation time was approximately 40 minutes.   ACCESS: 6 French sheath, left arm brachiocephalic fistula.   CONTRAST USED: Isovue 20 mL.   FLUORO TIME: Less than 0.1 minute.   INDICATIONS: Mr. Caleb Hanna is an [age] year old gentleman who presents with significant hematoma formation following dialysis run and the dialysis center noting difficulty with cannulation. The risks and benefits for imaging of the fistula and possible treatment of a central venous stenosis were reviewed, all questions are answered, and the patient agrees to proceed.   DESCRIPTION OF PROCEDURE: The patient is taken to special procedures and placed in the supine position. After adequate sedation is achieved, the left arm is extended palm upward and the left arm is prepped and draped in sterile fashion. 1% lidocaine is infiltrated into the soft tissues and access to the fistula is obtained with a micropuncture needle, microwire followed by micro sheath, J-wire followed by a 6 French sheath. Hand injection of contrast is then used to demonstrate the fistula, compression is used  to reflux image for the arterial image, and central veins are also evaluated. After review of the images, pursestring suture is placed and the sheath is removed. There are no immediate complications.   INTERPRETATION: The fistula itself appears widely patent and actually measures approximately 10 to 14 mm in diameter. There are several moderately sized side branches, however, they do not appear to have had a negative impact on the overall size of the cephalic vein proper. The cephalic vein confluence with the subclavian, the subclavian innominate, and superior vena cava are all widely patent. Reflux images demonstrate the arterial anastomosis is widely patent and visualized portions of the brachial artery are patent.   SUMMARY: There does not appear to be any technical problem or abnormality with the fistula. The fistula itself is matured and measures in the goodly size of 10 to 14 mm in diameter. No central venous strictures are noted. Continued accessing of the fistula is appropriate at this time. Hematoma should resolve without negative consequence.  ____________________________ Caleb DillsGregory G. Schnier, MD ggs:slb D: 06/09/2012 17:34:15 ET T: 06/10/2012 09:25:26 ET JOB#: [id number]  cc: Caleb DillsGregory G. Schnier, MD, <Dictator> Caleb HousekeeperMario Ernesto Olmedo, MD Caleb DillsGREGORY G SCHNIER MD ELECTRONICALLY SIGNED 06/14/2012 12:34

## 2014-12-17 NOTE — Discharge Summary (Signed)
PATIENT NAME:  Caleb Hanna, [name] MR#:  [id number] DATE OF BIRTH:  Sep 23, 1929  DATE OF ADMISSION:  08/08/2012 DATE OF DISCHARGE:  08/14/2012  PRIMARY CARE PHYSICIAN: Rolin BarryMario Olmedo, MD  DISCHARGE DIAGNOSES: 1. Left hip fracture status post open reduction and internal fixation. 2. Acute blood loss anemia.  3. End-stage renal disease, on hemodialysis.  4. Hypertension.  5. Diabetes mellitus. 6. Constipation.   CONSULTANTS: Juanell FairlyKevin Krasinski, MD - Orthopedics.   PROCEDURE: Left hip fracture open reduction and internal fixation.   IMAGING STUDIES: CT of the head without contrast showed diffuse atrophy, no acute intracranial abnormalities.   Ultrasound of the scrotum showed a large hernia on the right.   ADMITTING HISTORY AND PHYSICAL: Please see detailed history and physical dictated on 08/08/2012. In brief, this is an 81 year old Caucasian male patient, hard of hearing, with end-stage renal disease, myelofibrosis and coronary artery disease who presented to the Emergency Room after a mechanical fall and suffering a hip fracture on the left side. The patient was admitted to the hospitalist service after consulting orthopedics.   HOSPITAL COURSE: 1. Left hip fracture. The patient had left hip fracture status post open reduction and internal fixation by Dr. Martha ClanKrasinski. The patient was thought to be a moderate to high risk but tolerated the surgery well. Did have acute blood loss anemia and received 1 unit of packed RBC status post surgery and his hemoglobin is stable at the time of discharge. His Lovenox has been held, as discussed with Dr. Martha ClanKrasinski, and the patient is being discharged on aspirin.  2. The patient did have right inguinal hernia and will follow up with Dr. Michela PitcherEly as outpatient for surgery if needed in the future.  3. The patient's hypertension, end stage renal disease, on hemodialysis, and diabetes were well controlled during the hospital stay.   On the day of discharge, the patient's  temperature is 97.8, pulse 73, blood pressure 127/65, saturating 98% and is being discharged to a skilled nursing facility for further rehab. He does have some serous drainage from his wound, on examination, on the day of discharge, and follow up with Dr. Martha ClanKrasinski in a week.   DISCHARGE MEDICATIONS: 1. Aspirin 325 mg oral once a day.  2. Atorvastatin 10 mg oral once a day.  3. Avodart 0.5 mg oral once a day. 4. Cetirizine 10 mg oral once a day.  5. Irbesartan 300 mg oral once a day.  6. Isosorbide mononitrate 60 mg oral once a day.  7. Magnesium oxide 400 mg oral once a day.  8. Ranitidine 150 mg oral 2 times a day.  9. Tamsulosin 0.4 mg oral once a day.  10. Coreg 25 mg oral 2 times a day.  11. Glipizide XL 1/2 tablet oral once a day.  12. Pro-Stat 3 mL oral once a day.  13. Vitamin D3 1000 international units once a day.  14. Renal capsules 1 capsule oral once a day.  15. Norco 5/325 mg 1 tablet oral every 4 hours as needed.  16. Docusate 240 mg oral once a day at bedtime.  17. MiraLax one packet oral once a day as needed for constipation.   DISCHARGE INSTRUCTIONS: The patient will be on a diabetic renal diet, regular consistency, and activity as tolerated with physical therapy. Follow up with Dr. Martha ClanKrasinski in a         week. Please see wound care instructions, as put in by Dr. Martha ClanKrasinski. The patient will be on aspirin for his DVT prophylaxis instead  of Lovenox secondary to blood loss in the wound.   TIME SPENT: Today on the day of discharge and discharge activity was 45 minutes  ____________________________ Molinda Bailiff. Kern Gingras, MD srs:sb D: 08/14/2012 14:00:29 ET T: 08/14/2012 14:14:47 ET JOB#: [id number]  cc: Wardell Heath R. Tyeasha Ebbs, MD, <Dictator> Orie Fisherman MD ELECTRONICALLY SIGNED 08/15/2012 17:10

## 2014-12-17 NOTE — Consult Note (Signed)
Pt was in dialysis and I did not see him.  i spoke to his nurse and asked her to give the viscous xylocaine immediately before meals and see if he can eat better because of this.  Electronic Signatures: Scot JunElliott, Leon Goodnow T (MD)  (Signed on 21-Dec-13 12:02)  Authored  Last Updated: 21-Dec-13 12:02 by Scot JunElliott, Myliah Medel T (MD)

## 2014-12-17 NOTE — Consult Note (Signed)
Brief Consult Note: Diagnosis: Severe pain with swallowing-acute suspect esophageal candidiasis as it started this admission after surgery, possible irritation from intubation,  long hx of mild chronic reflux on Zantac and remote ulcer.   Patient was seen by consultant.   Orders entered.   Comments: Throat exam negative. Agree with IV Protonix bid, add Viscous Lido, Nystatin swish/swallow and IV Diflucan. Full liquid diet. Obtain bedside swallowing study tomorrow. This case was d/w Dr. Mechele CollinElliott in collaboration of care.  Electronic Signatures: Rowan BlaseMills, Klein Willcox Ann (NP)  (Signed 12-Dec-13 17:16)  Authored: Brief Consult Note   Last Updated: 12-Dec-13 17:16 by Rowan BlaseMills, Kemiya Batdorf Ann (NP)

## 2014-12-17 NOTE — Consult Note (Signed)
PATIENT NAME:  Caleb Hanna, Caleb Hanna MR#:  [id number] DATE OF BIRTH:  September 19, 1929  DATE OF CONSULTATION:  08/10/2012  REFERRING PHYSICIAN:  Dr. Nemiah Commander CONSULTING PHYSICIAN:  Ranae Plumber. Glenard Haring,  MD   PRIMARY CARE PHYSICIAN: Dr. Dione Housekeeper.   REASON FOR CONSULTATION: Burning in mid chest region requiring the patient to be unable to eat, occurs when he swallows.   HISTORY OF PRESENT ILLNESS: This 81 year old patient was admitted to the hospital 08/08/2012 for a right hip fracture following a fall. He underwent a repair of the hip fracture yesterday, did not need a hip replacement. The patient has noted severe trouble swallowing which started yesterday. He grimaces, and has been taking poor oral intake. His daughter at the bedside reports that he does have a history of chronic reflux, but has not been bothersome. He takes ranitidine at home for GERD. The patient says the symptoms of acute pain started yesterday. He denies food getting stuck or hung, nausea, vomiting, or abdominal pain.   PAST MEDICAL HISTORY:  1. Coronary artery disease status post five vessel coronary artery bypass graft.   2. Chronic anemia.  3. Hyperlipidemia.  4. Diabetes mellitus.  5. Endstage renal disease, began dialysis August 2013.  6. Arthritis.  7. Hypertension.  8. Remote history of bleeding ulcer, maybe in the 1970s. No recent problems.  9. Transient ischemic attack 2012.   PAST SURGICAL HISTORY:  1. Left wrist surgery.  2. Cholecystectomy.  3. Appendectomy.  4. Five-vessel coronary artery bypass graft.   5. Cardiac catheterization approximately 2011.   MEDICATIONS:  1. Vitamin D3 1,000 international units daily.  2. Tramadol 50 mg twice daily.  3. Tamsulosin 0.4 mg daily.  4. Renicaps 1 daily.  5. Ranitidine 150 mg twice daily.  6. ProStep 3 milliliters daily. 7. Norco 5/325 mg every four hours p.r.n. 1 to 2 tablets.  8. Magnesium 40 mg daily.  9. Imdur 1 tablet daily.   10. Irbesartan 300 milligrams daily.  11. Glipizide XL 2.5 mg daily.  12. Cetirizine 10 mg daily p.r.n. itching.  13. Coreg 25 mg twice daily. 14. Avodart 0.5 mg daily.  15. Atorvastatin 10 mg daily.  16. Aspirin 325 mg daily and has been taking this approximately for one year.   ALLERGIES: No known drug allergies.   HABITS: Negative tobacco or alcohol. He lives with his elderly wife who has Parkinson's disease. Adult daughter is active in his care and is an R.N.   REVIEW OF SYSTEMS: Positive for chronic heartburn. He takes ranitidine as there was some pruritus prior to his dialysis and ranitidine was thought to help with some of the pruritus. There is no contraindication to proton pump inhibitor. The patient denies NSAIDs. He has been on a full strength aspirin for a year. He has had baseline health prior to this admission. He has had pain with his right hip surgery. Remaining review of systems x10 negative.   PHYSICAL EXAMINATION:    GENERAL: Elderly Caucasian male in no acute distress.   VITAL SIGNS: Temperature 98.1, pulse 98, respirations 20, 127/62, pulse oximetry 94% on room air.   HEENT: Head is normocephalic and oropharynx is clear. Flashlight exam on posterior pharynx: No oral thrush, erythema, redness, or tonsillitis noted.   NECK: Supple. Trachea is midline. A little tenderness with palpation down the trachea region.   HEART: Heart tones S1, S2 without murmur.   LUNGS: Clear to auscultation. Respirations are nonlabored.   ABDOMEN: Soft, nontender, normal bowel sounds  throughout.   RECTAL: Exam deferred.   EXTREMITIES: With slight edema. Right hip dressing intact, not examined.   NEUROLOGIC: The patient is very hard of hearing. Has to have questions repeated multiple times, answers appropriately, seems to be a reasonable and accurate historian. Follows commands.   LABORATORY, RADIOLOGICAL AND DIAGNOSTIC DATA:  Admission lab work notable for a glucose 210, now 115, BUN  27, creatinine 2.51 consistent with history of dialysis. He had dialysis yesterday. BUN today 39, creatinine 2.95. Electrolytes unremarkable. Calcium 7.8, albumin 2.7, alkaline phosphatase 92, AST 20, ALT 16, total bilirubin 1.3. Troponin 0.07 to 0.06. Hemoglobin 10.2 to 9.6 postoperative. ProTime 19.2, INR 1.6. Hepatitis B screen negative. Chest x-ray PA and lateral showed interstitial edema, cardiomegaly, new from prior, bilateral hilar enlargement likely secondary to prominent central pulmonary vasculature. Hip shows a displaced intratrochanteric fracture on the right hip from 08/08/2009. Right hip shows follow-up 08/09/2012 post open reduction and internal fixation shows open reduction and internal fixation of the right hip. CT scan of the head for falls shows chronic and involutional changes without evidence of acute abnormalities.   IMPRESSION AND PLAN: Patient with acute episode of odynophagia. I witnessed him swallowing liquids and he just grimaces. There is no coughing or choking with this. Liquid seems to pass fine. It is just very painful. Oral mucosa is examined closely for possibility of oral thrush since he has received IV Cleocin during this admission. Possibility of esophageal candidiasis is still present even though  it is not visible on the tongue. Post pharynx is normal. He has a long history of heartburn and reflux but was not having any type of flare prior to admission. The acute odynophagia started abruptly yesterday. Recommend IV Protonix. He presents on oral ranitidine because of history of pruritus, hoping to treat two medical problems with one pill. He may benefit from discharge on a proton pump inhibitor, especially since he presents on full-strength aspirin. Would recommend aggressive treatment for odynophagia with viscous lidocaine, nystatin swish and swallow and IV Diflucan. Dr. Mechele CollinElliott recommends full liquid diet. He also recommends a bedside swallowing study tomorrow. The patient's  head CT scan showed no acute changes after fall. Daughter reports he did have a transient ischemic attack in 2012 that was a complication from medications given for leg hematoma. She said he had full recovery. He does not present with history of stroke, although the CT scan does show some chronic atrophy type changes. The patient has demonstrated no dysarthria. Follow-up pending study results.   This case was discussed with Dr. Mechele CollinElliott in collaboration of care.    ____________________________ Ranae PlumberKimberly A. Arvilla MarketMills, ANP kam:ap D: 08/10/2012 17:57:06 ET T: 08/11/2012 07:21:07 ET JOB#: [id number]  cc: Cala BradfordKimberly A. Arvilla MarketMills, ANP, <Dictator> Dione HousekeeperMario Ernesto Olmedo, MD Ranae PlumberKimberly A. Suzette BattiestMills RN, MSN, ANP-BC Adult Nurse Practitioner ELECTRONICALLY SIGNED 08/11/2012 8:51

## 2014-12-17 NOTE — Consult Note (Signed)
Brief Consult Note: Diagnosis: right hip fracture.   Patient was seen by consultant.   Recommend to proceed with surgery or procedure.   Recommend further assessment or treatment.   Orders entered.   Discussed with Attending MD.   Comments: [facility] with [facility], history of esrd on hd who was admitted after suffering a mechanical fall causing a fracture of his right hip .  Pt is hard of hearing but is an appropriate historian. He denies and chest pain or shortness of breath. He is able to ambulate with a walker at home. He is hemodynamically stable. EKG reveals sinus tachycardia with rbbb which is chronic. He had a couple of episodes of tachcyardia with wideneing of his qrs but does not appear to be ventricular tachycardia but abearing svt. His rbbb remains in the rhythm and qrs axis does not change. His risk is moderately high for surgery but he is opitmized as much as possible including beta blockers. Will get an echo preop to assess lv funciton to guide therapy intra and post op. Would continue beta blockers in the perioperative period. WOuld proceed with urgent surgery as planned. Will follow with you post op.  Electronic Signatures: Dalia HeadingFath, Catelynn Sparger A (MD)  (Signed 11-Dec-13 10:09)  Authored: Brief Consult Note   Last Updated: 11-Dec-13 10:09 by Dalia HeadingFath, Javionna Leder A (MD)

## 2014-12-17 NOTE — Op Note (Signed)
PATIENT NAME:  Caleb Hanna, Hersh E MR#:  [id number] DATE OF BIRTH:  07/05/1930  DATE OF PROCEDURE:  08/09/2012  PREOPERATIVE DIAGNOSIS: Displaced intertrochanteric fracture of right hip.   POSTOPERATIVE DIAGNOSIS: Displaced intertrochanteric fracture of right hip.   PROCEDURE PERFORMED: Right hip compression nailing with a Synthes hip screw (130 degree five hole plate, 95 mm lag screw, 5 cortical screws).   SURGEON: Valinda HoarHoward E. Shaney Deckman, M.D.   ANESTHESIA: General endotracheal.   COMPLICATIONS: None.   DRAINS: None.   ESTIMATED BLOOD LOSS: 250 mL.  REPLACEMENTS: None.   DESCRIPTION OF PROCEDURE: The patient was brought to the Operating Room where he underwent satisfactory general endotracheal anesthesia, in the supine position. Because of medical problems and an INR of 1.6, spinal was not indicated. The patient was placed on the fracture table and the left leg was flexed and abducted and the right leg was taken through reduction maneuver and placed in traction. Fluoroscopy showed excellent position of the fracture, on AP and lateral view. The right hip was prepped and draped in sterile fashion and a longitudinal incision made over the proximal lateral femur. Dissection was carried out sharply through subcutaneous tissue and fascia. The vastus lateralis muscle was elevated anteriorly from its posterior attachment exposing the lateral femur. A 1/4 inch drill hole was made and a 130 degree guide was used for advancing a guide pin into the head and neck under fluoroscopic control. This was felt to be in excellent position. The depth was measured at 100 mm. The step cut reamer was then used to create a tract for the screw within the head and neck. A 130 degree five-hole plate along with a 95 mm lag screw were inserted and the plate was affixed to the femur with a turkey-claw clamp. Fluoroscopy showed the lag screw to be in excellent position on AP and lateral views. The five cortical screw holes were  drilled and filled with cortical screws. Traction was released. I did not feel it needed a compression screw. Final fluoroscopy showed the hardware to be in excellent position and the fracture to be anatomic. The wound was then thoroughly irrigated and it was not bleeding significantly. I elected not to put a Hemovac in. The deep fascia was closed with 0 Vicryl and the subcutaneous tissue closed with 2-0 Vicryl. Skin was closed and closed with staples and a dry sterile dressing was applied. The patient's leg was taken out of traction and had good range of motion. He was transferred to his hospital bed, extubated, and taken to recovery in good condition. ____________________________ Valinda HoarHoward E. Samarie Pinder, MD hem:slb D: 08/09/2012 16:33:29 ET T: 08/09/2012 16:51:49 ET JOB#: [id number]  cc: Valinda HoarHoward E. Klye Besecker, MD, <Dictator> Valinda HoarHOWARD E Sair Faulcon MD ELECTRONICALLY SIGNED 08/09/2012 22:12

## 2014-12-17 NOTE — Consult Note (Signed)
Brief Consult Note: Diagnosis: Right intertrochanteric hip fracture.   Patient was seen by consultant.   Recommend to proceed with surgery or procedure.   Orders entered.   Discussed with Attending MD.   Comments: 81 year old male fell at home yesterday causing a right intertrochanteric hip fracture.  Admitted for medical evaluation and treatment and subsequent surgery if possible.   He has multiple medical problems and needed dialysis last night.  hemoglobin level 9.6 this AM.  I spoke to his son at length in the Emergency Room last night and explained options to him.  He and his sister wish to proceed with surgery if possble as this would enhance pain control and nursing care.  Risks and benefits of surgery were discussed at length including but not limited to infection, non union, nerve or blood vessed damage, non union, need for repeat surgery, blood clots and lung emboli, and death. Plan on surgery today if cleared.    Exam:  somnolent,  circulation/sensation/motor function good distally and skin intact.  Right leg short and rotated with pain on range of motion.    X-rays:  comminuted right intertrochanteric hip fracture   Plan:  open reduction and internal fixation of hip if cleared medically.  Electronic Signatures: Valinda HoarMiller, Rector Devonshire E (MD)  (Signed 11-Dec-13 08:01)  Authored: Brief Consult Note   Last Updated: 11-Dec-13 08:01 by Valinda HoarMiller, Charnee Turnipseed E (MD)

## 2014-12-17 NOTE — Consult Note (Signed)
Pt still complains about pain in lower chest with swallowing.  On the other hand he is refusing his viscous xylocaine which is to help the pain so he can get some nourishment down.  I asked the nurse to be sure to encourage him to take it before meals as he is supposed to.  I will be gone for 2 days and Dr. Bluford Kaufmannh will not see this patient unless you call about a problem.  Electronic Signatures: Scot JunElliott, Janyla Biscoe T (MD)  (Signed on 23-Dec-13 14:33)  Authored  Last Updated: 23-Dec-13 14:33 by Scot JunElliott, Cristianna Cyr T (MD)

## 2014-12-17 NOTE — Consult Note (Signed)
Brief Consult Note: Diagnosis: right scrotal hernia.   Patient was seen by consultant.   Consult note dictated.   Comments: no evidence incarceration- long standing per pt. Gen surg eval for any changes, pain.  Electronic Signatures: Riki AltesStoioff, Hayes Rehfeldt C (MD)  (Signed 13-Dec-13 17:07)  Authored: Brief Consult Note   Last Updated: 13-Dec-13 17:07 by Riki AltesStoioff, Tanessa Tidd C (MD)

## 2014-12-17 NOTE — Consult Note (Signed)
Pt with fall in hgb, likely from hip surgery.  No passage of blood per rectum today.  Pt with scrotal problem, urology consult pending. Due for dialysis tomorrow.  To get a unit of blood and have repeat CBC in morning.  Asprin is less likely to cause bleeding than lovenox, but lovenox is better at preventing post surgical clots.  VSS afebrile.  GI will see tomorrow.  Electronic Signatures: Scot JunElliott, Theresa Dohrman T (MD)  (Signed on 13-Dec-13 17:46)  Authored  Last Updated: 13-Dec-13 17:46 by Scot JunElliott, Abdullahi Vallone T (MD)

## 2014-12-17 NOTE — Consult Note (Signed)
Pt has been referred to Hospice since he no longer wants dialysis.  There is no other treatment for his esophagitis than he is already taking,  if he wants to take it.  I will sign off, reconsult if needed.  Electronic Signatures: Scot JunElliott, Robert T (MD)  (Signed on 26-Dec-13 11:39)  Authored  Last Updated: 26-Dec-13 11:39 by Scot JunElliott, Robert T (MD)

## 2014-12-20 NOTE — Discharge Summary (Signed)
PATIENT NAME:  Caleb Hanna, Caleb Hanna MR#:  [id number] DATE OF BIRTH:  1930-01-24  DATE OF ADMISSION:  08/08/2012  DATE OF DISCHARGE:  08/24/2012  DISCHARGE DIAGNOSES:  1. Right hip fracture with severe pain. 2. Acute blood loss anemia. 3. End-stage renal disease. 4. Hypertension. 5. Diabetes.  6. Elevated cardiac enzymes. 7. Severe systolic congestive heart failure, chronic, with no exacerbation during this hospitalization. 8. Scrotal swelling. 9. Intractable pain.   The patient converted into comfort care.   DISPOSITION: Hospice.  MEDICATIONS AT DISCHARGE: Morphine pump, to be addressed by Palliative Care. He also had Tylenol suppositories, Norco p.r.n. pain; morphine p.r.n. pain, intramuscular and morphine solution 20 mg/mL, 2 to 5 mg every one hour; Zofran p.r.n., OxyContin 20 mg every 12 hours. The rest of the medications have been stopped. The patient is going to have mostly comfort care.   HOSPITAL COURSE: The patient is a nice, 81 year old gentleman with history of hard to hear,  end-stage renal disease on hemodialysis, sacral decubitus, myelofibrosis, coronary artery disease, who presented to the ER on 08/08/2012 with a history of a fall, unfortunately suffering a hip fracture. The patient was admitted to the hospitalist service because of elevated troponin. Finally this elevation of troponins was due to his end-stage renal disease and a troponin leak.  The patient has evolved to have significant pain. He was taken to the OR on 08/09/2012 for displaced intratrochanteric fracture of the right hip by Dr. Deeann Saint. The procedure was a right hip compression nailing with assist hip screw. The patient had significant difficulty swallowing and pain with possible esophagitis and reflux esophagitis for what he was seen by Dr. Mechele Collin. IV Protonix was given, viscous lidocaine was given, and starting swish and swallow and IV Diflucan. The patient also developed scrotal swelling and tenderness for  what Dr. Lonna Cobb was consulted. Impression of his assessment was that he had a hernia without incarceration, with no significant changes on his care. Dr. Anda Kraft, general surgery, was also consulted. He was a high risk for elective surgery and repair, and they decided to proceed for a hernioplasty after discharge if the patient is still having some symptoms. The patient continued to have significant pain. He was transfused after surgery to keep his hemoglobin stable. He continued to do dialysis, did fine and he was about to be discharged on 08/14/2012 with a diagnosis of left hip fracture, acute blood loss anemia, end-stage renal disease, hypertension, diabetes and constipation. Please refer also to the discharge summary dictated by Dr. Milagros Loll on 08/13/2012 where it states the time of hospitalization from 08/08/2012 to 08/14/2012. The patient was not able to be discharged because he was not able to sit up, which is absolutely needed for outpatient dialysis. Pain control was continued and the patient was kept in the hospital for more therapy. The patient had several episodes of significant nausea and vomiting, but no aspiration noted after the event on 08/16/2012. Around 08/17/2012, they were starting to discuss the possibility of looking for L-TAC placement. The patient had started getting also a little bit confused with increased pain. The patient continued to have significant pain with swallowing, and an EGD was scheduled for December 20th showing small hiatal hernia, severe esophagitis from 25 cm to GEJ at 42 cm, moderate to severe gastritis of the entire body of the stomach for what Carafate and a PPI was given. Viscous lidocaine was given as well. Palliative care consult was obtained on 08/18/2012 due to intractable pain. Palliative care was  involved on his case, and they help out with the disposition. A family meeting was called to clarify medical goals and since the patient was not getting any better,  a decision was made to change for comfort care and discharged to Hospice. He decided to stop dialysis at that point, and his daughter and the family were agreeable to transfer for the Hospice. The patient was having significant problems with taking any medications orally for what his pain was very difficult to control. Dr. Harvie JuniorPhifer decided to [facility] once the patient arrived. He was in severe pain, severe suffering, and he and his family thought that the best thing to do would be just proceed with elimination of dialysis and comfort care for what we proceed with his discharge.   DISCHARGE TIME: 35 minutes    ____________________________ Felipa Furnaceoberto Sanchez Gutierrez, MD [email or web address] D: 08/25/2012 12:33:18 ET T: 08/26/2012 16:22:01 ET JOB#: [id number]  cc: Felipa Furnaceoberto Sanchez Gutierrez, MD, <Dictator> [place] Juanda ChanceSANCHEZ GUTIERRE MD ELECTRONICALLY SIGNED 08/31/2012 7:41
